# Patient Record
Sex: Female | Born: 1980 | Race: White | Hispanic: No | Marital: Married | Smoking: Never smoker
Health system: Southern US, Community
[De-identification: ages and names within clinical notes are randomized; demographics above are authoritative.]

## PROBLEM LIST (undated history)

## (undated) DIAGNOSIS — N6009 Solitary cyst of unspecified breast: Secondary | ICD-10-CM

## (undated) DIAGNOSIS — B977 Papillomavirus as the cause of diseases classified elsewhere: Secondary | ICD-10-CM

## (undated) HISTORY — DX: Papillomavirus as the cause of diseases classified elsewhere: B97.7

## (undated) HISTORY — DX: Solitary cyst of unspecified breast: N60.09

## (undated) HISTORY — PX: COLPOSCOPY: SHX161

---

## 2016-11-03 ENCOUNTER — Encounter: Payer: Self-pay | Admitting: Gynecology

## 2016-11-03 ENCOUNTER — Ambulatory Visit (INDEPENDENT_AMBULATORY_CARE_PROVIDER_SITE_OTHER): Payer: Managed Care, Other (non HMO) | Admitting: Gynecology

## 2016-11-03 VITALS — BP 116/74 | Ht 64.0 in | Wt 124.0 lb

## 2016-11-03 DIAGNOSIS — Z01411 Encounter for gynecological examination (general) (routine) with abnormal findings: Secondary | ICD-10-CM

## 2016-11-03 DIAGNOSIS — N6320 Unspecified lump in the left breast, unspecified quadrant: Secondary | ICD-10-CM

## 2016-11-03 NOTE — Addendum Note (Signed)
Addended by: Dayna BarkerGARDNER, KIMBERLY K on: 11/03/2016 03:35 PM   Modules accepted: Orders

## 2016-11-03 NOTE — Addendum Note (Signed)
Addended by: Dayna BarkerGARDNER, Sameena Artus K on: 11/03/2016 03:26 PM   Modules accepted: Orders

## 2016-11-03 NOTE — Progress Notes (Signed)
Sarah FiscalChristine Rogoff 03/24/1981 [id number]   History:    36 y.o.  for annual gyn exam who is a new patient to the practice. She stated that she [place] and was [place]. She did state that when she was much younger one of her Pap smear demonstrated HPV changes but had normal follow-up Pap smears. She never received the HPV vaccine series in the past. Her husband has a vasectomy she reports normal menstrual cycle. She was concerned that yesterday she noted an indurated area underneath her left breast. She denies any recent trauma, or any nipple discharge. She does state that she had a mammogram 2 years ago as a result of left breast cyst. The only family member with breast cancer is not grandmother at the age of 36. She's had 3 children all delivered vaginally. Patient with no other medical issues except that she is on Lexapro 10 mg provided by her psychiatrist as a result of her anxiety. She sees a psychiatrist every 3 months.  Past medical history,surgical history, family history and social history were all reviewed and documented in the EPIC chart.  Gynecologic History Patient's last menstrual period was 11/01/2016. Contraception: vasectomy Last Pap: Approximately 3 years ago. Results were: normal Last mammogram: Approximately 2 years ago. Results were: Patient reports benign breast cyst  Obstetric History OB History  Gravida Para Term Preterm AB Living  3 3       3   SAB TAB Ectopic Multiple Live Births               # Outcome Date GA Lbr Len/2nd Weight Sex Delivery Anes PTL Lv  3 Para      Vag-Spont     2 Para      Vag-Spont     1 Para      Vag-Spont          ROS: A ROS was performed and pertinent positives and negatives are included in the history.  GENERAL: No fevers or chills. HEENT: No change in vision, no earache, sore throat or sinus congestion. NECK: No pain or stiffness. CARDIOVASCULAR: No chest pain or  pressure. No palpitations. PULMONARY: No shortness of breath, cough or wheeze. GASTROINTESTINAL: No abdominal pain, nausea, vomiting or diarrhea, melena or bright red blood per rectum. GENITOURINARY: No urinary frequency, urgency, hesitancy or dysuria. MUSCULOSKELETAL: No joint or muscle pain, no back pain, no recent trauma. DERMATOLOGIC: No rash, no itching, no lesions. ENDOCRINE: No polyuria, polydipsia, no heat or cold intolerance. No recent change in weight. HEMATOLOGICAL: No anemia or easy bruising or bleeding. NEUROLOGIC: No headache, seizures, numbness, tingling or weakness. PSYCHIATRIC: No depression, no loss of interest in normal activity or change in sleep pattern.     Exam: chaperone present  BP 116/74   Ht 5\' 4"  (1.626 m)   Wt 124 lb (56.2 kg)   LMP 11/01/2016   BMI 21.28 kg/m   Body mass index is 21.28 kg/m.  General appearance : Well developed well nourished female. No acute distress HEENT: Eyes: no retinal hemorrhage or exudates,  Neck supple, trachea midline, no carotid bruits, no thyroidmegaly Lungs: Clear to auscultation, no rhonchi or wheezes, or rib retractions  Heart: Regular rate and rhythm, no murmurs or gallops Breast:Examined in sitting and supine position were symmetrical in appearance,   no skin retraction, no nipple inversion, no nipple discharge, no skin discoloration, no axillary or supraclavicular lymphadenopathy, indurated area underneath her  left breast near intercostal region note discernible mass nontender Abdomen: no palpable masses or tenderness, no rebound or guarding Extremities: no edema or skin discoloration or tenderness  Pelvic:  Bartholin, Urethra, Skene Glands: Within normal limits             Vagina: No gross lesions or discharge  Cervix: No gross lesions or discharge  Uterus  anteverted, normal size, shape and consistency, non-tender and mobile  Adnexa  Without masses or tenderness  Anus and perineum  normal   Rectovaginal  normal  sphincter tone without palpated masses or tenderness             Hemoccult not indicated     Assessment/Plan:  36 y.o. female for annual exam with much anxiety apprehension on this indurated area she noticed underneath her left breast and for this reason we are going to send her for a diagnostic mammogram and possible ultrasound. She will also return to the office sometime next week for the following fasting screening blood work: Comprehensive metabolic panel fasting lipid profile, TSH, CBC, and urinalysis. Pap smear with HPV screening was done today. Patient has received her flu vaccine.     Ok Edwards MD, 3:19 PM 11/03/2016

## 2016-11-03 NOTE — Addendum Note (Signed)
Addended by: Rushie GoltzSPANGLER, Breylin Dom on: 11/03/2016 03:29 PM   Modules accepted: Orders

## 2016-11-04 ENCOUNTER — Telehealth: Payer: Self-pay | Admitting: *Deleted

## 2016-11-04 DIAGNOSIS — N6320 Unspecified lump in the left breast, unspecified quadrant: Secondary | ICD-10-CM

## 2016-11-04 NOTE — Telephone Encounter (Signed)
Appointment on 11/06/16 @ 2:30pm at [facility] center pt informed.

## 2016-11-04 NOTE — Telephone Encounter (Signed)
-----   Message from Ok EdwardsJuan H Fernandez, MD sent at 11/03/2016  3:22 PM EST ----- Victorino DikeJennifer, please make an appointment for this patient at the breast center. Indurated area underneath her left breast 6:00 position she will need a diagnostic mammogram and possible ultrasound

## 2016-11-05 LAB — PAP IG AND HPV HIGH-RISK: HPV DNA HIGH RISK: NOT DETECTED

## 2016-11-06 ENCOUNTER — Ambulatory Visit
Admission: RE | Admit: 2016-11-06 | Discharge: 2016-11-06 | Disposition: A | Discharge status: Home / Self Care | Payer: Managed Care, Other (non HMO) | Source: Ambulatory Visit | Attending: Gynecology | Admitting: Gynecology

## 2016-11-06 ENCOUNTER — Other Ambulatory Visit: Payer: Self-pay | Admitting: Gynecology

## 2016-11-06 DIAGNOSIS — N6320 Unspecified lump in the left breast, unspecified quadrant: Secondary | ICD-10-CM

## 2016-11-10 ENCOUNTER — Other Ambulatory Visit: Payer: Managed Care, Other (non HMO)

## 2016-11-10 LAB — URINALYSIS W MICROSCOPIC + REFLEX CULTURE
Bacteria, UA: NONE SEEN [HPF]
Bilirubin Urine: NEGATIVE
CASTS: NONE SEEN [LPF]
CRYSTALS: NONE SEEN [HPF]
Glucose, UA: NEGATIVE
Hgb urine dipstick: NEGATIVE
KETONES UR: NEGATIVE
Leukocytes, UA: NEGATIVE
Nitrite: NEGATIVE
PH: 6.5 (ref 5.0–8.0)
Protein, ur: NEGATIVE
RBC / HPF: NONE SEEN RBC/HPF (ref <=2)
SPECIFIC GRAVITY, URINE: 1.003 (ref 1.001–1.035)
Squamous Epithelial / LPF: NONE SEEN [HPF] (ref <=5)
WBC UA: NONE SEEN WBC/HPF (ref <=5)
Yeast: NONE SEEN [HPF]

## 2016-11-10 LAB — COMPREHENSIVE METABOLIC PANEL
ALBUMIN: 4.6 g/dL (ref 3.6–5.1)
ALK PHOS: 69 U/L (ref 33–115)
ALT: 15 U/L (ref 6–29)
AST: 17 U/L (ref 10–30)
BUN: 13 mg/dL (ref 7–25)
CALCIUM: 9.3 mg/dL (ref 8.6–10.2)
CHLORIDE: 103 mmol/L (ref 98–110)
CO2: 26 mmol/L (ref 20–31)
Creat: 0.85 mg/dL (ref 0.50–1.10)
Glucose, Bld: 83 mg/dL (ref 65–99)
POTASSIUM: 4 mmol/L (ref 3.5–5.3)
Sodium: 137 mmol/L (ref 135–146)
TOTAL PROTEIN: 7.4 g/dL (ref 6.1–8.1)
Total Bilirubin: 0.7 mg/dL (ref 0.2–1.2)

## 2016-11-10 LAB — CBC WITH DIFFERENTIAL/PLATELET
BASOS ABS: 0 {cells}/uL (ref 0–200)
BASOS PCT: 0 %
EOS ABS: 48 {cells}/uL (ref 15–500)
Eosinophils Relative: 1 %
HEMATOCRIT: 42.9 % (ref 35.0–45.0)
HEMOGLOBIN: 14.2 g/dL (ref 11.7–15.5)
LYMPHS ABS: 2064 {cells}/uL (ref 850–3900)
Lymphocytes Relative: 43 %
MCH: 29.8 pg (ref 27.0–33.0)
MCHC: 33.1 g/dL (ref 32.0–36.0)
MCV: 90.1 fL (ref 80.0–100.0)
MONO ABS: 288 {cells}/uL (ref 200–950)
MONOS PCT: 6 %
MPV: 10 fL (ref 7.5–12.5)
NEUTROS ABS: 2400 {cells}/uL (ref 1500–7800)
Neutrophils Relative %: 50 %
Platelets: 246 10*3/uL (ref 140–400)
RBC: 4.76 MIL/uL (ref 3.80–5.10)
RDW: 13.1 % (ref 11.0–15.0)
WBC: 4.8 10*3/uL (ref 3.8–10.8)

## 2016-11-10 LAB — LIPID PANEL
CHOL/HDL RATIO: 2.8 ratio (ref <5.0)
CHOLESTEROL: 219 mg/dL — AB (ref <200)
HDL: 79 mg/dL (ref >50)
LDL Cholesterol: 128 mg/dL — ABNORMAL HIGH (ref <100)
TRIGLYCERIDES: 60 mg/dL (ref <150)
VLDL: 12 mg/dL (ref <30)

## 2016-11-10 LAB — TSH: TSH: 1.42 mIU/L

## 2017-01-27 ENCOUNTER — Encounter: Payer: Self-pay | Admitting: Gynecology

## 2017-11-09 ENCOUNTER — Encounter: Payer: Managed Care, Other (non HMO) | Admitting: Obstetrics & Gynecology

## 2017-11-09 DIAGNOSIS — Z0289 Encounter for other administrative examinations: Secondary | ICD-10-CM

## 2018-03-01 ENCOUNTER — Ambulatory Visit: Payer: Managed Care, Other (non HMO) | Admitting: Obstetrics & Gynecology

## 2018-03-01 ENCOUNTER — Encounter: Payer: Self-pay | Admitting: Obstetrics & Gynecology

## 2018-03-01 VITALS — BP 116/72 | Ht 63.5 in | Wt 133.0 lb

## 2018-03-01 DIAGNOSIS — R6882 Decreased libido: Secondary | ICD-10-CM

## 2018-03-01 DIAGNOSIS — Z30015 Encounter for initial prescription of vaginal ring hormonal contraceptive: Secondary | ICD-10-CM | POA: Diagnosis not present

## 2018-03-01 DIAGNOSIS — Z01419 Encounter for gynecological examination (general) (routine) without abnormal findings: Secondary | ICD-10-CM

## 2018-03-01 MED ORDER — ETONOGESTREL-ETHINYL ESTRADIOL 0.12-0.015 MG/24HR VA RING: 1.0000 | VAGINAL_RING | VAGINAL | 4 refills | Status: DC

## 2018-03-01 NOTE — Progress Notes (Signed)
Sarah Bolton 20-Nov-1980 [id number]   History:    37 y.o. G3P3L3 Married.  7, 10 and 37 yo.  RP:  Established patient presenting for annual gyn exam   HPI:  Menses normal every month.  No BTB.  No pelvic pain.  Normal vaginal secretions.  No pain with IC.  Decreased libido.  On Lexapro 10 mg PO daily for anxiety.  Will see Psychiatrist next week.  Breasts normal/stable x 10/2016.  Had a Bilateral Dx mammo/US 10/2016 because of an area of increased density felt at 6 O'clock on the left breast.  Bilateral Dx mammo negative, bilateral US negative.  BMI 23.19.  Fit and healthy diet.  Will f/u here for Fasting Health Labs.  Past medical history,surgical history, family history and social history were all reviewed and documented in the EPIC chart.  Gynecologic History Patient's last menstrual period was 02/15/2018. Contraception: condoms Last Pap: 10/2016. Results were: Negative, HPV HR neg Last mammogram: 10/2016. Results were: Negative Bone Density: Never Colonoscopy: Never  Obstetric History OB History  Gravida Para Term Preterm AB Living  '3 3       3  '$ SAB TAB Ectopic Multiple Live Births               # Outcome Date GA Lbr Len/2nd Weight Sex Delivery Anes PTL Lv  3 Para      Vag-Spont     2 Para      Vag-Spont     1 Para      Vag-Spont        ROS: A ROS was performed and pertinent positives and negatives are included in the history.  GENERAL: No fevers or chills. HEENT: No change in vision, no earache, sore throat or sinus congestion. NECK: No pain or stiffness. CARDIOVASCULAR: No chest pain or pressure. No palpitations. PULMONARY: No shortness of breath, cough or wheeze. GASTROINTESTINAL: No abdominal pain, nausea, vomiting or diarrhea, melena or bright red blood per rectum. GENITOURINARY: No urinary frequency, urgency, hesitancy or dysuria. MUSCULOSKELETAL: No joint or muscle pain, no back pain, no recent trauma. DERMATOLOGIC: No rash, no itching, no lesions. ENDOCRINE: No  polyuria, polydipsia, no heat or cold intolerance. No recent change in weight. HEMATOLOGICAL: No anemia or easy bruising or bleeding. NEUROLOGIC: No headache, seizures, numbness, tingling or weakness. PSYCHIATRIC: No depression, no loss of interest in normal activity or change in sleep pattern.     Exam:   BP 116/72   Ht 5' 3.5" (1.613 m)   Wt 133 lb (60.3 kg)   LMP 02/15/2018   BMI 23.19 kg/m   Body mass index is 23.19 kg/m.  General appearance : Well developed well nourished female. No acute distress HEENT: Eyes: no retinal hemorrhage or exudates,  Neck supple, trachea midline, no carotid bruits, no thyroidmegaly Lungs: Clear to auscultation, no rhonchi or wheezes, or rib retractions  Heart: Regular rate and rhythm, no murmurs or gallops Breast:Examined in sitting and supine position were symmetrical in appearance, no palpable masses or tenderness,  no skin retraction, no nipple inversion, no nipple discharge, no skin discoloration, no axillary or supraclavicular lymphadenopathy Abdomen: no palpable masses or tenderness, no rebound or guarding Extremities: no edema or skin discoloration or tenderness  Pelvic: Vulva: Normal             Vagina: No gross lesions or discharge  Cervix: No gross lesions or discharge.  Pap reflex done.  Uterus  AV, normal size, shape and consistency, non-tender and mobile  Adnexa  Without masses or tenderness  Anus: Normal   Assessment/Plan:  37 y.o. female for annual exam   1. Encounter for routine gynecological examination with Papanicolaou smear of cervix Normal gynecologic exam.  Pap reflex done.  Breast exam normal.  Bilateral diagnostic mammogram and ultrasound February 2018 were completely negative.  Will start screening mammogram at 79.  No first-degree relative with breast cancer.  Good body mass index at 23.19.  Continue with fitness and healthy nutrition.  Will follow up here for fasting health labs. - CBC; Future - Comp Met (CMET);  Future - TSH; Future - Lipid panel; Future - VITAMIN D 25 Hydroxy (Vit-D Deficiency, Fractures); Future  2. Encounter for initial prescription of vaginal ring hormonal contraceptive Needs contraception and would like to reduce her menstrual flow.  Used NuvaRing successfully in the past.  Would like to restart on it.  No contraindication.  Non-smoker.  Low risk of blood clot leading to pulmonary embolism or stroke and low risk of increasing her blood pressure discussed.  Will try NuvaRing continuously, usage reviewed.  Prescription sent to pharmacy.  3. Low libido Multifactorial causes to low libido discussed with patient.  Will discuss the possible decrease of libido associated with Lexapro with her psychiatrist.  Will see how Nuvaring alters her libido or not with time.  Recommend discussing with husband.  Other orders - etonogestrel-ethinyl estradiol (NUVARING) 0.12-0.015 MG/24HR vaginal ring; Place 1 each vaginally every 28 (twenty-eight) days. Insert vaginally and leave in place for 4 consecutive weeks, then switch rings.   Princess Bruins MD, 2:27 PM 03/01/2018

## 2018-03-01 NOTE — Patient Instructions (Signed)
1. Encounter for routine gynecological examination with Papanicolaou smear of cervix Normal gynecologic exam.  Pap reflex done.  Breast exam normal.  Bilateral diagnostic mammogram and ultrasound February 2018 were completely negative.  Will start screening mammogram at 23.  No first-degree relative with breast cancer.  Good body mass index at 23.19.  Continue with fitness and healthy nutrition.  Will follow up here for fasting health labs. - CBC; Future - Comp Met (CMET); Future - TSH; Future - Lipid panel; Future - VITAMIN D 25 Hydroxy (Vit-D Deficiency, Fractures); Future  2. Encounter for initial prescription of vaginal ring hormonal contraceptive Needs contraception and would like to reduce her menstrual flow.  Used NuvaRing successfully in the past.  Would like to restart on it.  No contraindication.  Non-smoker.  Low risk of blood clot leading to pulmonary embolism or stroke and low risk of increasing her blood pressure discussed.  Will try NuvaRing continuously, usage reviewed.  Prescription sent to pharmacy.  3. Low libido Multifactorial causes to low libido discussed with patient.  Will discuss the possible decrease of libido associated with Lexapro with her psychiatrist.  Will see how Nuvaring alters her libido or not with time.  Recommend discussing with husband.  Other orders - etonogestrel-ethinyl estradiol (NUVARING) 0.12-0.015 MG/24HR vaginal ring; Place 1 each vaginally every 28 (twenty-eight) days. Insert vaginally and leave in place for 4 consecutive weeks, then switch rings.  Gray, it was a pleasure meeting you today!  I will inform you of your results as soon as they are available.

## 2018-03-02 LAB — PAP IG W/ RFLX HPV ASCU

## 2019-03-03 ENCOUNTER — Other Ambulatory Visit: Payer: Self-pay

## 2019-03-03 ENCOUNTER — Encounter: Payer: Self-pay | Admitting: Obstetrics & Gynecology

## 2019-03-03 ENCOUNTER — Ambulatory Visit (INDEPENDENT_AMBULATORY_CARE_PROVIDER_SITE_OTHER): Payer: 59 | Admitting: Obstetrics & Gynecology

## 2019-03-03 VITALS — BP 124/78 | Ht 63.0 in | Wt 139.0 lb

## 2019-03-03 DIAGNOSIS — Z9189 Other specified personal risk factors, not elsewhere classified: Secondary | ICD-10-CM

## 2019-03-03 DIAGNOSIS — Z01419 Encounter for gynecological examination (general) (routine) without abnormal findings: Secondary | ICD-10-CM

## 2019-03-03 NOTE — Addendum Note (Signed)
Addended by: Lorine Bears on: 03/03/2019 03:37 PM   Modules accepted: Orders

## 2019-03-03 NOTE — Progress Notes (Signed)
Sarah Bolton 02/11/1981 [id number]   History:    38 y.o. G3P3L3 Married.  Vasectomy. Children are 04-24-37 yo.  RP:  Established patient presenting for annual gyn exam   HPI: Menses regular normal.  No BTB.  No pelvic pain.  No pain with IC. Vasectomy.  Urine/BMs normal.  Breasts normal.  BMI 24.62.    Past medical history,surgical history, family history and social history were all reviewed and documented in the EPIC chart.  Gynecologic History No LMP recorded. Contraception: Vasectomy Last Pap: 02/2018. Results were: Negative Last mammogram: 10/2016. Results were: Negative Bone Density: Never Colonoscopy: Never  Obstetric History OB History  Gravida Para Term Preterm AB Living  3 3       3   SAB TAB Ectopic Multiple Live Births               # Outcome Date GA Lbr Len/2nd Weight Sex Delivery Anes PTL Lv  3 Para      Vag-Spont     2 Para      Vag-Spont     1 Para      Vag-Spont        ROS: A ROS was performed and pertinent positives and negatives are included in the history.  GENERAL: No fevers or chills. HEENT: No change in vision, no earache, sore throat or sinus congestion. NECK: No pain or stiffness. CARDIOVASCULAR: No chest pain or pressure. No palpitations. PULMONARY: No shortness of breath, cough or wheeze. GASTROINTESTINAL: No abdominal pain, nausea, vomiting or diarrhea, melena or bright red blood per rectum. GENITOURINARY: No urinary frequency, urgency, hesitancy or dysuria. MUSCULOSKELETAL: No joint or muscle pain, no back pain, no recent trauma. DERMATOLOGIC: No rash, no itching, no lesions. ENDOCRINE: No polyuria, polydipsia, no heat or cold intolerance. No recent change in weight. HEMATOLOGICAL: No anemia or easy bruising or bleeding. NEUROLOGIC: No headache, seizures, numbness, tingling or weakness. PSYCHIATRIC: No depression, no loss of interest in normal activity or change in sleep pattern.     Exam:   BP 124/78 (BP Location: Right Arm, Patient Position:  Sitting, Cuff Size: Normal)   Ht 5\' 3"  (1.6 m)   Wt 139 lb (63 kg)   BMI 24.62 kg/m   Body mass index is 24.62 kg/m.  General appearance : Well developed well nourished female. No acute distress HEENT: Eyes: no retinal hemorrhage or exudates,  Neck supple, trachea midline, no carotid bruits, no thyroidmegaly Lungs: Clear to auscultation, no rhonchi or wheezes, or rib retractions  Heart: Regular rate and rhythm, no murmurs or gallops Breast:Examined in sitting and supine position were symmetrical in appearance, no palpable masses or tenderness,  no skin retraction, no nipple inversion, no nipple discharge, no skin discoloration, no axillary or supraclavicular lymphadenopathy Abdomen: no palpable masses or tenderness, no rebound or guarding Extremities: no edema or skin discoloration or tenderness  Pelvic: Vulva: Normal             Vagina: No gross lesions or discharge  Cervix: No gross lesions or discharge.  Pap/HPV HR done  Uterus  AV, normal size, shape and consistency, non-tender and mobile  Adnexa  Without masses or tenderness  Anus: Normal   Assessment/Plan:  38 y.o. female for annual exam   1. Encounter for routine gynecological examination with Papanicolaou smear of cervix Normal gynecologic exam.  Pap test with high-risk HPV done today.  Breast exam normal.  Good body mass index at 24.62.  2. Relies on partner vasectomy for contraception  Other  orders - buPROPion (WELLBUTRIN XL) 150 MG 24 hr tablet; Take 1 tablet by mouth daily.  Sarah Bruins MD, 3:10 PM 03/03/2019

## 2019-03-03 NOTE — Patient Instructions (Signed)
1. Encounter for routine gynecological examination with Papanicolaou smear of cervix Normal gynecologic exam.  Pap test with high-risk HPV done today.  Breast exam normal.  Good body mass index at 24.62.  2. Relies on partner vasectomy for contraception  Other orders - buPROPion (WELLBUTRIN XL) 150 MG 24 hr tablet; Take 1 tablet by mouth daily.  Sarah Bolton, it was a pleasure seeing you today!  I will inform you of your results as soon as they are available.

## 2019-03-07 LAB — PAP, TP IMAGING W/ HPV RNA, RFLX HPV TYPE 16,18/45: HPV DNA High Risk: NOT DETECTED

## 2019-09-29 ENCOUNTER — Telehealth: Payer: Self-pay | Admitting: *Deleted

## 2019-09-29 MED ORDER — ETONOGESTREL-ETHINYL ESTRADIOL 0.12-0.015 MG/24HR VA RING: 1.0000 | VAGINAL_RING | VAGINAL | 2 refills | Status: DC

## 2019-09-29 NOTE — Telephone Encounter (Signed)
Agree with prescription of Nuvaring 3 rings, refill x2.  Annual/Gyn visit 02/2020.

## 2019-09-29 NOTE — Telephone Encounter (Signed)
Patient aware, Rx sent.  

## 2019-09-29 NOTE — Telephone Encounter (Signed)
Patient has used nuvaring in past for contraception would like to have Rx sent to pharmacy. Reports her husband is not going to have a Vasectomy at this time. Please advise

## 2019-10-27 ENCOUNTER — Telehealth: Payer: Self-pay

## 2019-10-27 NOTE — Telephone Encounter (Signed)
Recently found a breast lump. Recommended office visit. Transferred to [place] to schedule.

## 2019-10-31 ENCOUNTER — Ambulatory Visit: Payer: 59 | Admitting: Obstetrics & Gynecology

## 2019-11-02 ENCOUNTER — Ambulatory Visit: Payer: Managed Care, Other (non HMO) | Admitting: Obstetrics & Gynecology

## 2019-11-08 ENCOUNTER — Other Ambulatory Visit: Payer: Self-pay

## 2019-11-09 ENCOUNTER — Telehealth: Payer: Self-pay | Admitting: *Deleted

## 2019-11-09 ENCOUNTER — Ambulatory Visit: Payer: Managed Care, Other (non HMO) | Admitting: Obstetrics & Gynecology

## 2019-11-09 ENCOUNTER — Encounter: Payer: Self-pay | Admitting: Obstetrics & Gynecology

## 2019-11-09 VITALS — BP 122/78

## 2019-11-09 DIAGNOSIS — N6311 Unspecified lump in the right breast, upper outer quadrant: Secondary | ICD-10-CM

## 2019-11-09 NOTE — Telephone Encounter (Addendum)
Orders placed at breast center, scheduled on 12/14/19 @ 10:40am. Patient informed with below, she will call to see if any cancellations per breast center recommendations for sooner appointment.

## 2019-11-09 NOTE — Patient Instructions (Addendum)
1. Breast lump on right side at 10 o'clock position Rt breast lump unchanged x 2 months.  Very dense superior aspect of the breast.  Nodule/cyst 1.5 x 2 cm, mobile, NT, smooth borders at external 10 O'Clock location.  Skin normal.  Nipple inversion (longstanding).  No Rt axillary LN felt.  No fam h/o Breast Ca.  Counseling done.  Will proceed with a R[facility] Dx Mammo[facility].  Sarah Bolton, it was a pleasure seeing you today!

## 2019-11-09 NOTE — Progress Notes (Signed)
    Sarah Bolton December 31, 1980 [id number]        39 y.o.  G3P3L3  RP: Rt Breast lump x 2 months  HPI: Felt a Rt breast lump x 2 months.  No change in size.  Not tender.  No skin change.  Longstanding Rt nipple inversion, no nipple discharge.  No fam H/O Breast Ca.   OB History  Gravida Para Term Preterm AB Living  3 3       3   SAB TAB Ectopic Multiple Live Births               # Outcome Date GA Lbr Len/2nd Weight Sex Delivery Anes PTL Lv  3 Para      Vag-Spont     2 Para      Vag-Spont     1 Para      Vag-Spont       Past medical history,surgical history, problem list, medications, allergies, family history and social history were all reviewed and documented in the EPIC chart.   Directed ROS with pertinent positives and negatives documented in the history of present illness/assessment and plan.  Exam:  Vitals:   11/09/19 1126  BP: 122/78   General appearance:  Normal  Breast exam:  Left breast normal.  No Lt axillary LN felt.                         Right breast:  Very dense superior aspect of the breast.  Nodule/cyst 1.5 x 2 cm, mobile, NT, smooth borders at external 10 O'Clock location.  Skin normal.  Nipple inversion (longstanding).  No Rt axillary LN felt.   Assessment/Plan:  39 y.o. G3P3   1. Breast lump on right side at 10 o'clock position Rt breast lump unchanged x 2 months.  Very dense superior aspect of the breast.  Nodule/cyst 1.5 x 2 cm, mobile, NT, smooth borders at external 10 O'Clock location.  Skin normal.  Nipple inversion (longstanding).  No Rt axillary LN felt.  No fam h/o Breast Ca.  Counseling done.  Will proceed with a Rt Dx Mammo/[facility] at [facility].  20 MD, 11:31 AM 11/09/2019

## 2019-11-09 NOTE — Telephone Encounter (Signed)
-----   Message from Genia Del, MD sent at 11/09/2019 11:40 AM EST ----- Regarding: Rt Dx Mammo/US Rt breast lump x 2 months.  Rt breast nodule/cyst at external quadrant/10 O'clock 1.5 x 2 cm, mobile, NT, smooth.  Dense breast.

## 2019-12-14 ENCOUNTER — Ambulatory Visit
Admission: RE | Admit: 2019-12-14 | Discharge: 2019-12-14 | Disposition: A | Discharge status: Home / Self Care | Payer: Managed Care, Other (non HMO) | Source: Ambulatory Visit | Attending: Obstetrics & Gynecology | Admitting: Obstetrics & Gynecology

## 2019-12-14 ENCOUNTER — Other Ambulatory Visit: Payer: Self-pay | Admitting: Obstetrics & Gynecology

## 2019-12-14 ENCOUNTER — Ambulatory Visit
Admission: RE | Admit: 2019-12-14 | Discharge: 2019-12-14 | Disposition: A | Discharge status: Home / Self Care | Payer: 59 | Source: Ambulatory Visit | Attending: Obstetrics & Gynecology | Admitting: Obstetrics & Gynecology

## 2019-12-14 ENCOUNTER — Other Ambulatory Visit: Payer: Self-pay

## 2019-12-14 DIAGNOSIS — N6311 Unspecified lump in the right breast, upper outer quadrant: Secondary | ICD-10-CM

## 2019-12-14 DIAGNOSIS — N6310 Unspecified lump in the right breast, unspecified quadrant: Secondary | ICD-10-CM

## 2020-03-05 ENCOUNTER — Encounter: Payer: 59 | Admitting: Obstetrics & Gynecology

## 2020-06-08 IMAGING — MG DIGITAL DIAGNOSTIC BILAT W/ TOMO W/ CAD
6 of 12 series · 6 of 36 positions shown · non-contrast
Comparison: Previous exam(s).

CLINICAL DATA: 38-year-old female presenting with a palpable area
of concern felt by the patient and her doctor in the upper-outer
quadrant of the right breast measuring approximately 2 cm.

EXAM:
DIGITAL DIAGNOSTIC BILATERAL MAMMOGRAM WITH TOMO
ULTRASOUND RIGHT BREAST

[R MLO synth-2D (1 of 3)]
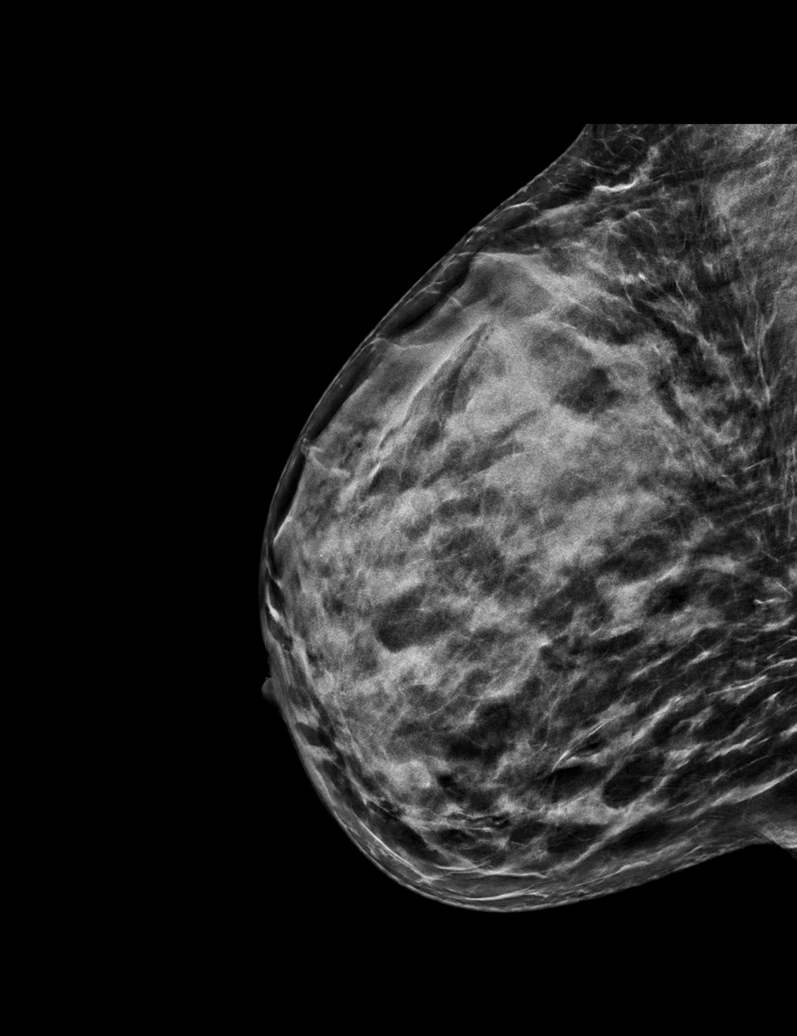

[L MLO synth-2D]
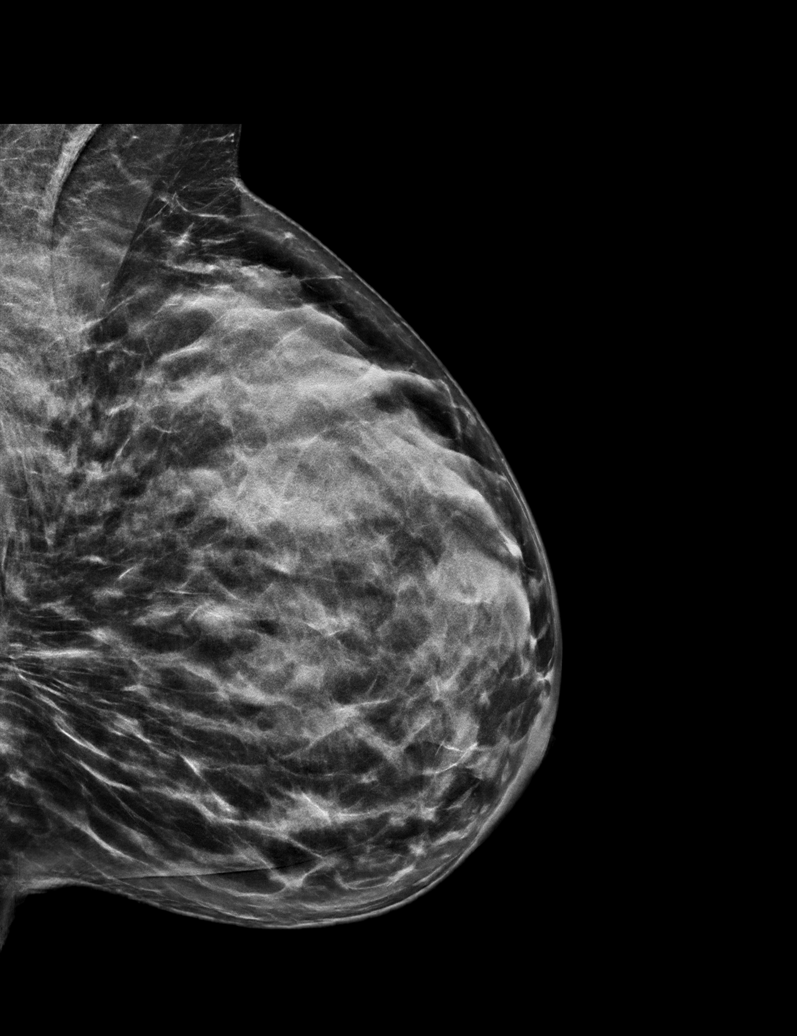

[R MLO synth-2D (2 of 3)]
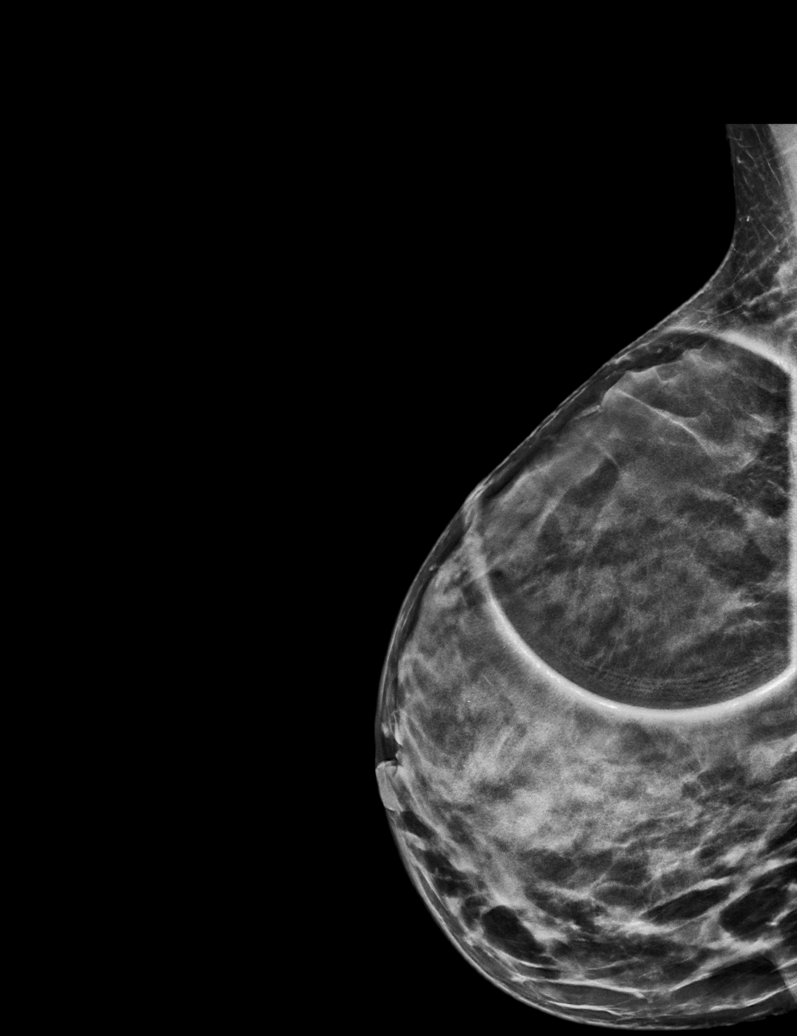

[R CC synth-2D]
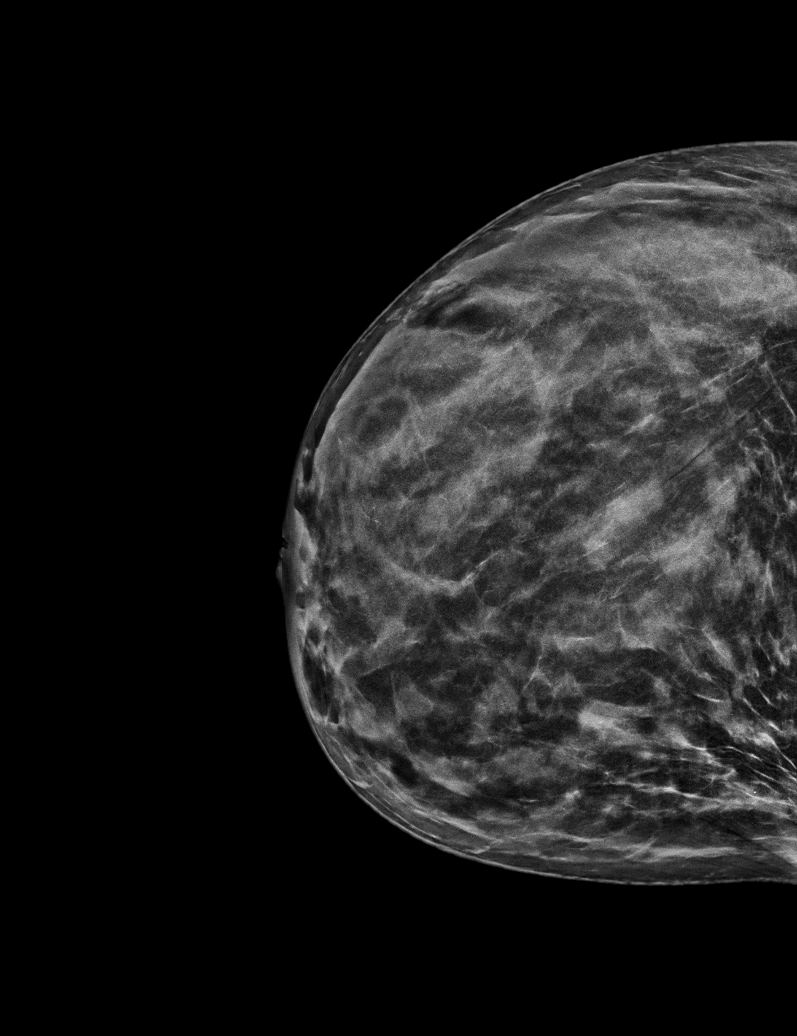

[R MLO synth-2D (3 of 3)]
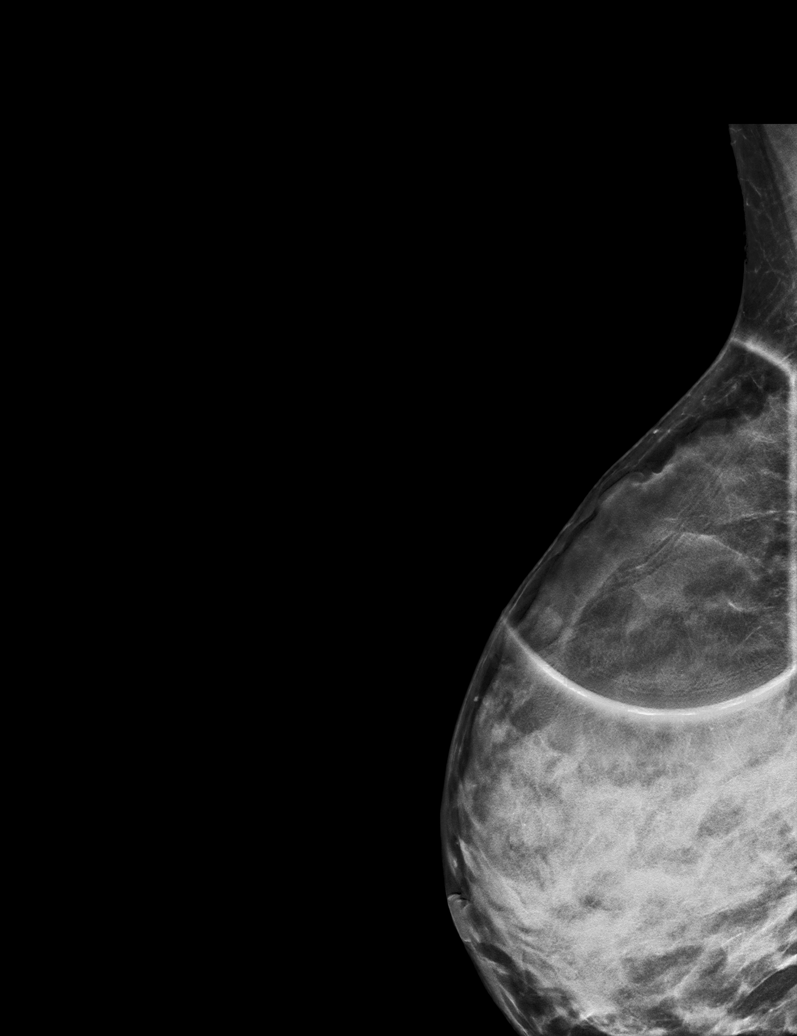

[L CC synth-2D]
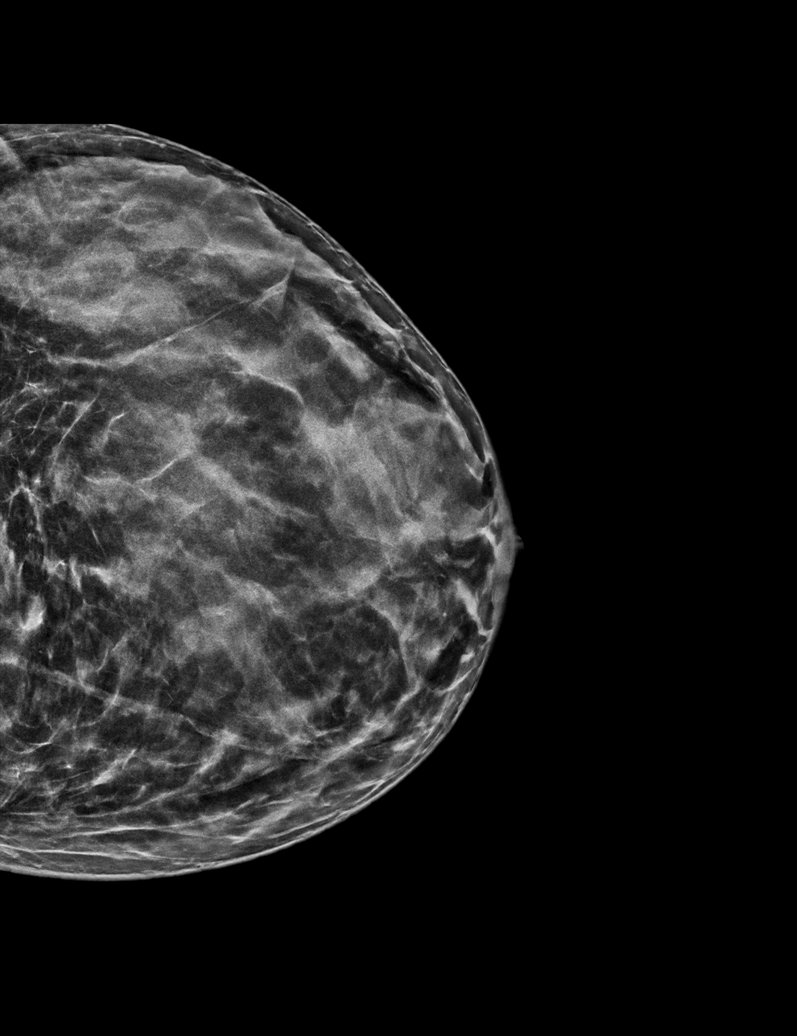

[6 of 36 positions shown; findings below may reference images not displayed]

ACR Breast Density Category d: The breast tissue is extremely dense,
which lowers the sensitivity of mammography.
FINDINGS: Mammogram:

Right breast: In the upper outer quadrant of the right breast seen
only on the MLO view there is an oval circumscribed mass measuring
approximately 1.0 cm. No other findings identified in the right
breast.

Left breast: No suspicious mass, distortion, or microcalcifications
are identified to suggest presence of malignancy.

On physical exam, there is a discrete smooth mobile mass in the
upper outer right breast at the palpable site of concern.

Ultrasound:

Targeted ultrasound is performed in the right breast at 10 o'clock 7
cm from the nipple demonstrating an oval circumscribed hypoechoic
mass measuring 1.3 x 0.7 x 1.2 cm. This corresponds to the
mammographic finding and the palpable site of concern. Targeted
ultrasound the right axilla demonstrates normal-appearing lymph
nodes.
IMPRESSION: Right breast mass at 10 o'clock measuring 1.3 cm is probably benign
and likely represents a fibroadenoma.

RECOMMENDATION:
Right breast ultrasound in 6 months.

I have discussed the findings and recommendations with the patient.
If applicable, a reminder letter will be sent to the patient
regarding the next appointment.

BI-RADS CATEGORY  3: Probably benign.

## 2020-06-17 ENCOUNTER — Other Ambulatory Visit: Payer: 59

## 2020-09-20 ENCOUNTER — Encounter: Payer: Managed Care, Other (non HMO) | Admitting: Obstetrics & Gynecology

## 2020-10-30 ENCOUNTER — Encounter: Payer: Self-pay | Admitting: Obstetrics & Gynecology

## 2020-10-30 ENCOUNTER — Ambulatory Visit (INDEPENDENT_AMBULATORY_CARE_PROVIDER_SITE_OTHER): Payer: 59 | Admitting: Obstetrics & Gynecology

## 2020-10-30 ENCOUNTER — Other Ambulatory Visit: Payer: Self-pay

## 2020-10-30 VITALS — BP 120/70 | Ht 63.5 in | Wt 116.0 lb

## 2020-10-30 DIAGNOSIS — Z01419 Encounter for gynecological examination (general) (routine) without abnormal findings: Secondary | ICD-10-CM

## 2020-10-30 DIAGNOSIS — Z3044 Encounter for surveillance of vaginal ring hormonal contraceptive device: Secondary | ICD-10-CM

## 2020-10-30 MED ORDER — ETONOGESTREL-ETHINYL ESTRADIOL 0.12-0.015 MG/24HR VA RING: 1.0000 | VAGINAL_RING | VAGINAL | 4 refills | Status: DC

## 2020-10-30 NOTE — Progress Notes (Signed)
Sarah Bolton Jan 14, 1981 [id number]   History:    40 y.o. G3P3L3 Married.  Children are 9+-12-14 yo.  RP:  Established patient presenting for annual gyn exam   HPI: Well on Nuvaring continuously.  No BTB.  No pelvic pain.  No pain with IC. Urine/BMs normal.  Breasts normal.  BMI decreased to 20.23.  F/U fasting Health Labs here.   Past medical history,surgical history, family history and social history were all reviewed and documented in the EPIC chart.  Gynecologic History Patient's last menstrual period was 10/09/2020.  Obstetric History OB History  Gravida Para Term Preterm AB Living  $Remov'3 3       3  '[id number]$ SAB IAB Ectopic Multiple Live Births               # Outcome Date GA Lbr Len/2nd Weight Sex Delivery Anes PTL Lv  3 Para      Vag-Spont     2 Para      Vag-Spont     1 Para      Vag-Spont        ROS: A ROS was performed and pertinent positives and negatives are included in the history.  GENERAL: No fevers or chills. HEENT: No change in vision, no earache, sore throat or sinus congestion. NECK: No pain or stiffness. CARDIOVASCULAR: No chest pain or pressure. No palpitations. PULMONARY: No shortness of breath, cough or wheeze. GASTROINTESTINAL: No abdominal pain, nausea, vomiting or diarrhea, melena or bright red blood per rectum. GENITOURINARY: No urinary frequency, urgency, hesitancy or dysuria. MUSCULOSKELETAL: No joint or muscle pain, no back pain, no recent trauma. DERMATOLOGIC: No rash, no itching, no lesions. ENDOCRINE: No polyuria, polydipsia, no heat or cold intolerance. No recent change in weight. HEMATOLOGICAL: No anemia or easy bruising or bleeding. NEUROLOGIC: No headache, seizures, numbness, tingling or weakness. PSYCHIATRIC: No depression, no loss of interest in normal activity or change in sleep pattern.     Exam:   BP 120/70   Ht 5' 3.5" (1.613 m)   Wt 116 lb (52.6 kg)   LMP 10/09/2020 Comment: NUVARING  BMI 20.23 kg/m   Body mass index is 20.23  kg/m.  General appearance : Well developed well nourished female. No acute distress HEENT: Eyes: no retinal hemorrhage or exudates,  Neck supple, trachea midline, no carotid bruits, no thyroidmegaly Lungs: Clear to auscultation, no rhonchi or wheezes, or rib retractions  Heart: Regular rate and rhythm, no murmurs or gallops Breast:Examined in sitting and supine position were symmetrical in appearance, no palpable masses or tenderness,  no skin retraction, no nipple inversion, no nipple discharge, no skin discoloration, no axillary or supraclavicular lymphadenopathy Abdomen: no palpable masses or tenderness, no rebound or guarding Extremities: no edema or skin discoloration or tenderness  Pelvic: Vulva: Normal             Vagina: No gross lesions or discharge  Cervix: No gross lesions or discharge.  Pap reflex done.  Uterus  AV, normal size, shape and consistency, non-tender and mobile  Adnexa  Without masses or tenderness  Anus: Normal   Assessment/Plan:  40 y.o. female for annual exam   1. Encounter for routine gynecological examination with Papanicolaou smear of cervix Normal gynecologic exam.  Pap reflex done.  Breast exam normal.  Will start screening mammogram at age 54.  Good BMI at 20.23.  Continue with fitness and healthy nutrition.  Follow-up here for fasting health labs. - CBC; Future - Comp Met (CMET); Future -  TSH; Future - Lipid Profile; Future - Vitamin D 1,25 dihydroxy; Future  2. Encounter for surveillance of vaginal ring hormonal contraceptive device Well on NuvaRing.  No contraindication to continue.  Prescription sent to pharmacy.  Other orders - buPROPion (WELLBUTRIN XL) 150 MG 24 hr tablet; Take 150 mg by mouth daily. - etonogestrel-ethinyl estradiol (NUVARING) 0.12-0.015 MG/24HR vaginal ring; Place 1 each vaginally every 28 (twenty-eight) days. Insert vaginally and leave in place for 3 consecutive weeks, then remove for 1 week.  Princess Bruins MD, 11:48 AM  10/30/2020

## 2020-10-31 ENCOUNTER — Other Ambulatory Visit: Payer: 59

## 2020-10-31 LAB — PAP IG W/ RFLX HPV ASCU

## 2020-11-06 ENCOUNTER — Other Ambulatory Visit: Payer: Self-pay

## 2020-11-06 DIAGNOSIS — R87612 Low grade squamous intraepithelial lesion on cytologic smear of cervix (LGSIL): Secondary | ICD-10-CM

## 2020-11-09 ENCOUNTER — Encounter: Payer: Self-pay | Admitting: Obstetrics & Gynecology

## 2020-11-27 ENCOUNTER — Other Ambulatory Visit: Payer: Self-pay

## 2020-11-27 ENCOUNTER — Other Ambulatory Visit: Payer: Self-pay | Admitting: Obstetrics & Gynecology

## 2020-11-27 ENCOUNTER — Encounter: Payer: Self-pay | Admitting: Obstetrics & Gynecology

## 2020-11-27 ENCOUNTER — Ambulatory Visit: Payer: 59 | Admitting: Obstetrics & Gynecology

## 2020-11-27 DIAGNOSIS — R87612 Low grade squamous intraepithelial lesion on cytologic smear of cervix (LGSIL): Secondary | ICD-10-CM

## 2020-11-27 NOTE — Addendum Note (Signed)
Addended by: Berna Spare A on: 11/27/2020 02:13 PM   Modules accepted: Orders

## 2020-11-27 NOTE — Progress Notes (Signed)
    Sarah Bolton 11/02/1980 [id number]        40 y.o.  G3P3L3 Married.  RP: LGSIL for Colposcopy  HPI: Patient reports having a positive HR HPV at age 40.  Normal Paps since then, until last Pap 10/30/2020 showing LGSIL.  No HPV HR done.   OB History  Gravida Para Term Preterm AB Living  3 3       3   SAB IAB Ectopic Multiple Live Births               # Outcome Date GA Lbr Len/2nd Weight Sex Delivery Anes PTL Lv  3 Para      Vag-Spont     2 Para      Vag-Spont     1 Para      Vag-Spont       Past medical history,surgical history, problem list, medications, allergies, family history and social history were all reviewed and documented in the EPIC chart.   Directed ROS with pertinent positives and negatives documented in the history of present illness/assessment and plan.  Exam:  Vitals:   11/27/20 0857  BP: 130/76   General appearance:  Normal  Colposcopy Procedure Note Pa [facility] 11/27/2020  Indications: LGSIL  Procedure Details  The risks and benefits of the procedure and Verbal informed consent obtained.  Speculum placed in vagina and excellent visualization of cervix achieved, cervix swabbed x 3 with acetic acid solution.  Findings:  Cervix colposcopy:  Vaginal colposcopy:  Vulvar colposcopy:  Perirectal colposcopy:  The cervix was sprayed with Hurricane before performing the cervical biopsies.  Specimens: HPV HR.  Cervical Bxs at 4 O'Clock and 11 O'Clock  Complications:  None, good hemostasis with Silver Nitrate . Plan:  Management per results   Assessment/Plan:  40 y.o. G3P3   1. LGSIL on Pap smear of cervix Counseling on abnormal Pap and HR HPV done.  Colposcopy procedure and findings reviewed with patient.  Management per results.  Post procedure precautions reviewed. - Colposcopy  24 MD, 9:01 AM 11/27/2020

## 2020-12-02 LAB — PATHOLOGY REPORT

## 2020-12-02 LAB — TISSUE PATH REPORT

## 2020-12-04 LAB — HPV MRNA, HIGH RISK, RFLX 16,18/45: HPV DNA High Risk: DETECTED — AB

## 2020-12-04 LAB — HPV TYPE 16 AND 18/45 RNA
HPV Type 16 RNA: NOT DETECTED
HPV Type 18/45 RNA: NOT DETECTED

## 2021-01-23 ENCOUNTER — Encounter: Payer: Self-pay | Admitting: Obstetrics & Gynecology

## 2021-01-23 ENCOUNTER — Ambulatory Visit (INDEPENDENT_AMBULATORY_CARE_PROVIDER_SITE_OTHER): Payer: 59 | Admitting: Obstetrics & Gynecology

## 2021-01-23 ENCOUNTER — Other Ambulatory Visit: Payer: Self-pay

## 2021-01-23 VITALS — BP 120/70

## 2021-01-23 DIAGNOSIS — N898 Other specified noninflammatory disorders of vagina: Secondary | ICD-10-CM | POA: Diagnosis not present

## 2021-01-23 LAB — WET PREP FOR TRICH, YEAST, CLUE

## 2021-01-23 MED ORDER — FLUCONAZOLE 150 MG PO TABS: 150.0000 mg | ORAL_TABLET | Freq: Every day | ORAL | 1 refills | Status: AC

## 2021-01-23 MED ORDER — TINIDAZOLE 500 MG PO TABS: 1000.0000 mg | ORAL_TABLET | Freq: Two times a day (BID) | ORAL | 0 refills | Status: AC

## 2021-01-23 NOTE — Progress Notes (Signed)
    Sarah Bolton Apr 29, 1981 [id number]        40 y.o.  G3P3L3   RP: Vaginal itching with odor  HPI: Vaginal discharge with odors x 1 week.  Mild itching.  No pelvic pain.  No fever.  Urine/BMs normal.   OB History  Gravida Para Term Preterm AB Living  3 3       3   SAB IAB Ectopic Multiple Live Births               # Outcome Date GA Lbr Len/2nd Weight Sex Delivery Anes PTL Lv  3 Para      Vag-Spont     2 Para      Vag-Spont     1 Para      Vag-Spont       Past medical history,surgical history, problem list, medications, allergies, family history and social history were all reviewed and documented in the EPIC chart.   Directed ROS with pertinent positives and negatives documented in the history of present illness/assessment and plan.  Exam:  Vitals:   01/23/21 1449  BP: 120/70   General appearance:  Normal  Gynecologic exam: Vulva normal.  Speculum:  Cervix/Vagina normal.  Mild increase in vaginal discharge. Gono-Chlam on cervix.  Wet prep done.  Wet prep:  Yeasts present.  Clue cells present.  Odor   Assessment/Plan:  40 y.o. G3P3   1. Vagina itching Yeast vaginitis confirmed by wet prep.  Will treat with fluconazole 150 mg 1 tablet per mouth daily for 3 days.  Usage reviewed and prescription sent to pharmacy. - WET PREP FOR TRICH, YEAST, CLUE  2. Vaginal odor Bacterial vaginosis confirmed by wet prep.  Will treat with tinidazole 2 tablets twice a day for 2 days.  Patient will start with that treatment and then take the fluconazole.  Usage reviewed and prescription sent to pharmacy. - WET PREP FOR TRICH, YEAST, CLUE - C. trachomatis/N. gonorrhoeae RNA  Other orders - fluconazole (DIFLUCAN) 150 MG tablet; Take 1 tablet (150 mg total) by mouth daily for 3 days. - tinidazole (TINDAMAX) 500 MG tablet; Take 2 tablets (1,000 mg total) by mouth 2 (two) times daily for 2 days.  24 MD, 2:56 PM 01/23/2021

## 2021-01-24 ENCOUNTER — Encounter: Payer: Self-pay | Admitting: Obstetrics & Gynecology

## 2021-01-24 LAB — C. TRACHOMATIS/N. GONORRHOEAE RNA
C. trachomatis RNA, TMA: NOT DETECTED
N. gonorrhoeae RNA, TMA: NOT DETECTED

## 2021-07-18 ENCOUNTER — Ambulatory Visit (INDEPENDENT_AMBULATORY_CARE_PROVIDER_SITE_OTHER): Payer: 59 | Admitting: Obstetrics & Gynecology

## 2021-07-18 ENCOUNTER — Other Ambulatory Visit: Payer: Self-pay

## 2021-07-18 ENCOUNTER — Encounter: Payer: Self-pay | Admitting: Obstetrics & Gynecology

## 2021-07-18 ENCOUNTER — Other Ambulatory Visit (HOSPITAL_COMMUNITY)
Admission: RE | Admit: 2021-07-18 | Discharge: 2021-07-18 | Disposition: A | Discharge status: Home / Self Care | Payer: 59 | Source: Ambulatory Visit | Attending: Obstetrics & Gynecology | Admitting: Obstetrics & Gynecology

## 2021-07-18 VITALS — BP 118/68

## 2021-07-18 DIAGNOSIS — N870 Mild cervical dysplasia: Secondary | ICD-10-CM | POA: Diagnosis not present

## 2021-07-18 DIAGNOSIS — K602 Anal fissure, unspecified: Secondary | ICD-10-CM

## 2021-07-18 DIAGNOSIS — R87810 Cervical high risk human papillomavirus (HPV) DNA test positive: Secondary | ICD-10-CM | POA: Insufficient documentation

## 2021-07-18 NOTE — Progress Notes (Signed)
    Sarah Bolton 04-17-1981 [id number]        40 y.o.  G3P3   RP: Repeat Pap post CIN 1 at [facility] 11/2020  HPI: CIN 1 at [facility] 11/2020.  HPV HR pos, HPV 16-18-45 Neg. Complaint of a little bit of blood after passing stools today.   OB History  Gravida Para Term Preterm AB Living  3 3       3   SAB IAB Ectopic Multiple Live Births               # Outcome Date GA Lbr Len/2nd Weight Sex Delivery Anes PTL Lv  3 Para      Vag-Spont     2 Para      Vag-Spont     1 Para      Vag-Spont       Past medical history,surgical history, problem list, medications, allergies, family history and social history were all reviewed and documented in the EPIC chart.   Directed ROS with pertinent positives and negatives documented in the history of present illness/assessment and plan.  Exam:  Vitals:   07/18/21 1428  BP: 118/68   General appearance:  Normal  Gynecologic exam: Vulva normal.  Speculum:  Cervix/vagina normal.  Pap/HPV HR done.   Assessment/Plan:  40 y.o. G3P3   1. 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  High risk HPV was positive, but HPV 16-18-45 were negative.  Repeat Pap test with high-risk HPV done today.  Management per results. - Cytology - PAP( [place])  2. Cervical high risk HPV (human papillomavirus) test positive - Cytology - PAP( [place])  3. Anal fissure Very small anal fissure.  Counseling on management done.  Other orders - buPROPion (WELLBUTRIN XL) 300 MG 24 hr tablet; Take 300 mg by mouth daily as needed.   06-07-1993 MD, 2:34 PM 07/18/2021

## 2021-07-22 LAB — CYTOLOGY - PAP
Comment: NEGATIVE
Diagnosis: NEGATIVE
High risk HPV: NEGATIVE

## 2021-10-31 ENCOUNTER — Ambulatory Visit: Payer: 59 | Admitting: Obstetrics & Gynecology

## 2021-12-03 ENCOUNTER — Other Ambulatory Visit (HOSPITAL_COMMUNITY)
Admission: RE | Admit: 2021-12-03 | Discharge: 2021-12-03 | Disposition: A | Discharge status: Home / Self Care | Payer: Self-pay | Source: Ambulatory Visit | Attending: Obstetrics & Gynecology | Admitting: Obstetrics & Gynecology

## 2021-12-03 ENCOUNTER — Encounter: Payer: Self-pay | Admitting: Obstetrics & Gynecology

## 2021-12-03 ENCOUNTER — Ambulatory Visit (INDEPENDENT_AMBULATORY_CARE_PROVIDER_SITE_OTHER): Payer: 59 | Admitting: Obstetrics & Gynecology

## 2021-12-03 ENCOUNTER — Other Ambulatory Visit: Payer: Self-pay

## 2021-12-03 VITALS — BP 108/70 | HR 68 | Resp 16 | Ht 63.5 in | Wt 120.0 lb

## 2021-12-03 DIAGNOSIS — R87810 Cervical high risk human papillomavirus (HPV) DNA test positive: Secondary | ICD-10-CM | POA: Diagnosis not present

## 2021-12-03 DIAGNOSIS — Z01419 Encounter for gynecological examination (general) (routine) without abnormal findings: Secondary | ICD-10-CM

## 2021-12-03 DIAGNOSIS — Z3044 Encounter for surveillance of vaginal ring hormonal contraceptive device: Secondary | ICD-10-CM

## 2021-12-03 DIAGNOSIS — N870 Mild cervical dysplasia: Secondary | ICD-10-CM

## 2021-12-03 MED ORDER — ETONOGESTREL-ETHINYL ESTRADIOL 0.12-0.015 MG/24HR VA RING: 1.0000 | VAGINAL_RING | VAGINAL | 4 refills | Status: AC

## 2021-12-03 NOTE — Progress Notes (Signed)
? ? ?Sarah Bolton 06/28/1981 [id number] ? ? ?History:    41 y.o. G3P3L3 Married.  Children are 81+-13-15 yo.  Moving back to [place]. ?  ?RP:  Established patient presenting for annual gyn exam  ?  ?HPI: Well on Nuvaring continuously.  No BTB.  No pelvic pain.  No pain with IC. Occasionally increased vaginal discharge/itching, not currently.  Colpo 11/2020 CIN 1.  HR HPV Pos, HPV 16-18-45 Negative.  Pap reflex today. Urine/BMs normal.  Breasts normal.  Will schedule first screening mammo.  BMI stable at 20.92.  Fasting Health Labs here today. ?  ? ?Past medical history,surgical history, family history and social history were all reviewed and documented in the EPIC chart. ? ?Gynecologic History ?No LMP recorded. (Menstrual status: Other). ? ?Obstetric History ?OB History  ?Gravida Para Term Preterm AB Living  ?_0 ?SAB IAB Ectopic Multiple Live Births  ?           ?  ?# Outcome Date GA Lbr Len/2nd Weight Sex Delivery Anes PTL Lv  ?3 Para      Vag-Spont     ?2 Para      Vag-Spont     ?1 Para      Vag-Spont     ? ? ? ?ROS: A ROS was performed and pertinent positives and negatives are included in the history. ? GENERAL: No fevers or chills. HEENT: No change in vision, no earache, sore throat or sinus congestion. NECK: No pain or stiffness. CARDIOVASCULAR: No chest pain or pressure. No palpitations. PULMONARY: No shortness of breath, cough or wheeze. GASTROINTESTINAL: No abdominal pain, nausea, vomiting or diarrhea, melena or bright red blood per rectum. GENITOURINARY: No urinary frequency, urgency, hesitancy or dysuria. MUSCULOSKELETAL: No joint or muscle pain, no back pain, no recent trauma. DERMATOLOGIC: No rash, no itching, no lesions. ENDOCRINE: No polyuria, polydipsia, no heat or cold intolerance. No recent change in weight. HEMATOLOGICAL: No anemia or easy bruising or bleeding. NEUROLOGIC: No headache, seizures, numbness, tingling or weakness. PSYCHIATRIC: No depression, no loss of interest in  normal activity or change in sleep pattern.  ?  ? ?Exam: ? ? ?BP 108/70   Pulse 68   Resp 16   Ht 5' 3.5" (1.613 m)   Wt 120 lb (54.4 kg)   BMI 20.92 kg/m?  ? ?Body mass index is 20.92 kg/m?. ? ?General appearance : Well developed well nourished female. No acute distress ?HEENT: Eyes: no retinal hemorrhage or exudates,  Neck supple, trachea midline, no carotid bruits, no thyroidmegaly ?Lungs: Clear to auscultation, no rhonchi or wheezes, or rib retractions  ?Heart: Regular rate and rhythm, no murmurs or gallops ?Breast:Examined in sitting and supine position were symmetrical in appearance, no palpable masses or tenderness,  no skin retraction, no nipple inversion, no nipple discharge, no skin discoloration, no axillary or supraclavicular lymphadenopathy ?Abdomen: no palpable masses or tenderness, no rebound or guarding ?Extremities: no edema or skin discoloration or tenderness ? ?Pelvic: Vulva: Normal ?            Vagina: No gross lesions or discharge ? Cervix: No gross lesions or discharge.  Pap reflex done. ? Uterus  AV, normal size, shape and consistency, non-tender and mobile ? Adnexa  Without masses or tenderness ? Anus: Normal ? ? ?Assessment/Plan:  41 y.o. female for annual exam  ? ?1. Encounter for routine gynecological examination with Papanicolaou smear of cervix ?Well on Nuvaring continuously.  No BTB.  No pelvic pain.  No pain with IC. Occasionally increased vaginal discharge/itching, not currently.  Colpo 11/2020 CIN 1.  HR HPV Pos, HPV 16-18-45 Negative.  Pap reflex today. Urine/BMs normal.  Breasts normal.  Will schedule first screening mammo.  BMI stable at 20.92.  Fasting Health Labs here today. ?- CBC ?- Comp Met (CMET) ?- TSH ?- Lipid Profile ?- Vitamin D (25 hydroxy) ?- Cytology - PAP( [place]) ? ?2. Dysplasia of cervix, low grade (CIN 1) ?- Cytology - PAP( [place]) ? ?3. Cervical high risk HPV (human papillomavirus) test positive ?- Cytology - PAP( [place]) ? ?4. Encounter for  surveillance of vaginal ring hormonal contraceptive device ?Well on continuous NuvaRing.  No CI to continue.  Prescription sent to pharmacy. ? ?Other orders ?- UNABLE TO FIND; Med Name: allergy med ?- etonogestrel-ethinyl estradiol (NUVARING) 0.12-0.015 MG/24HR vaginal ring; Place 1 each vaginally every 28 (twenty-eight) days. Insert vaginally and leave in place for 3 consecutive weeks, then remove for 1 week.  ? ?Princess Bruins MD, 9:36 AM 12/03/2021 ? ?  ?

## 2021-12-04 LAB — COMPREHENSIVE METABOLIC PANEL
AG Ratio: 1.6 (calc) (ref 1.0–2.5)
ALT: 7 U/L (ref 6–29)
AST: 12 U/L (ref 10–30)
Albumin: 4.4 g/dL (ref 3.6–5.1)
Alkaline phosphatase (APISO): 59 U/L (ref 31–125)
BUN: 16 mg/dL (ref 7–25)
CO2: 24 mmol/L (ref 20–32)
Calcium: 9.1 mg/dL (ref 8.6–10.2)
Chloride: 104 mmol/L (ref 98–110)
Creat: 0.93 mg/dL (ref 0.50–0.99)
Globulin: 2.8 g/dL (calc) (ref 1.9–3.7)
Glucose, Bld: 82 mg/dL (ref 65–99)
Potassium: 4.3 mmol/L (ref 3.5–5.3)
Sodium: 137 mmol/L (ref 135–146)
Total Bilirubin: 0.6 mg/dL (ref 0.2–1.2)
Total Protein: 7.2 g/dL (ref 6.1–8.1)

## 2021-12-04 LAB — CBC
HCT: 43.5 % (ref 35.0–45.0)
Hemoglobin: 14.4 g/dL (ref 11.7–15.5)
MCH: 29.7 pg (ref 27.0–33.0)
MCHC: 33.1 g/dL (ref 32.0–36.0)
MCV: 89.7 fL (ref 80.0–100.0)
MPV: 10.3 fL (ref 7.5–12.5)
Platelets: 247 10*3/uL (ref 140–400)
RBC: 4.85 10*6/uL (ref 3.80–5.10)
RDW: 11.9 % (ref 11.0–15.0)
WBC: 8.3 10*3/uL (ref 3.8–10.8)

## 2021-12-04 LAB — TSH: TSH: 1.15 mIU/L

## 2021-12-04 LAB — LIPID PANEL
Cholesterol: 252 mg/dL — ABNORMAL HIGH (ref <200)
HDL: 89 mg/dL (ref >=50)
LDL Cholesterol (Calc): 144 mg/dL (calc) — ABNORMAL HIGH
Non-HDL Cholesterol (Calc): 163 mg/dL (calc) — ABNORMAL HIGH (ref <130)
Total CHOL/HDL Ratio: 2.8 (calc) (ref <5.0)
Triglycerides: 86 mg/dL (ref <150)

## 2021-12-04 LAB — CYTOLOGY - PAP: Diagnosis: NEGATIVE

## 2021-12-04 LAB — VITAMIN D 25 HYDROXY (VIT D DEFICIENCY, FRACTURES): Vit D, 25-Hydroxy: 14 ng/mL — ABNORMAL LOW (ref 30–100)

## 2021-12-09 ENCOUNTER — Other Ambulatory Visit: Payer: Self-pay | Admitting: *Deleted

## 2021-12-09 DIAGNOSIS — E559 Vitamin D deficiency, unspecified: Secondary | ICD-10-CM

## 2021-12-09 MED ORDER — VITAMIN D (ERGOCALCIFEROL) 1.25 MG (50000 UNIT) PO CAPS: 50000.0000 [IU] | ORAL_CAPSULE | ORAL | 0 refills | Status: AC
